# Patient Record
Sex: Female | Born: 1995 | Race: Black or African American | Hispanic: No | Marital: Single | Smoking: Never smoker
Health system: Southern US, Community
[De-identification: ages and names within clinical notes are randomized; demographics above are authoritative.]

## PROBLEM LIST (undated history)

## (undated) DIAGNOSIS — K635 Polyp of colon: Secondary | ICD-10-CM

## (undated) HISTORY — PX: COLOSTOMY: SHX63

## (undated) HISTORY — PX: COLOSTOMY REVERSAL: SHX5782

---

## 2017-09-04 ENCOUNTER — Encounter (HOSPITAL_COMMUNITY): Payer: Self-pay | Admitting: Emergency Medicine

## 2017-09-04 ENCOUNTER — Emergency Department (HOSPITAL_COMMUNITY)
Admission: EM | Admit: 2017-09-04 | Discharge: 2017-09-04 | Disposition: A | Discharge status: Home / Self Care | Payer: Self-pay | Attending: Emergency Medicine | Admitting: Emergency Medicine

## 2017-09-04 DIAGNOSIS — K529 Noninfective gastroenteritis and colitis, unspecified: Secondary | ICD-10-CM | POA: Insufficient documentation

## 2017-09-04 HISTORY — DX: Polyp of colon: K63.5

## 2017-09-04 LAB — COMPREHENSIVE METABOLIC PANEL
ALBUMIN: 3.7 g/dL (ref 3.5–5.0)
ALT: 14 U/L (ref 0–44)
ANION GAP: 8 (ref 5–15)
AST: 19 U/L (ref 15–41)
Alkaline Phosphatase: 58 U/L (ref 38–126)
BILIRUBIN TOTAL: 0.8 mg/dL (ref 0.3–1.2)
BUN: 7 mg/dL (ref 6–20)
CO2: 26 mmol/L (ref 22–32)
Calcium: 9 mg/dL (ref 8.9–10.3)
Chloride: 103 mmol/L (ref 98–111)
Creatinine, Ser: 0.89 mg/dL (ref 0.44–1.00)
GFR calc non Af Amer: >60 mL/min (ref >60)
GLUCOSE: 80 mg/dL (ref 70–99)
POTASSIUM: 3.9 mmol/L (ref 3.5–5.1)
SODIUM: 137 mmol/L (ref 135–145)
TOTAL PROTEIN: 6.9 g/dL (ref 6.5–8.1)

## 2017-09-04 LAB — LIPASE, BLOOD: Lipase: 31 U/L (ref 11–51)

## 2017-09-04 LAB — CBC
HEMATOCRIT: 44.1 % (ref 36.0–46.0)
Hemoglobin: 14.2 g/dL (ref 12.0–15.0)
MCH: 28.2 pg (ref 26.0–34.0)
MCHC: 32.2 g/dL (ref 30.0–36.0)
MCV: 87.7 fL (ref 78.0–100.0)
Platelets: 301 10*3/uL (ref 150–400)
RBC: 5.03 MIL/uL (ref 3.87–5.11)
RDW: 12.9 % (ref 11.5–15.5)
WBC: 5.5 10*3/uL (ref 4.0–10.5)

## 2017-09-04 LAB — I-STAT TROPONIN, ED: TROPONIN I, POC: 0.01 ng/mL (ref 0.00–0.08)

## 2017-09-04 LAB — POC URINE PREG, ED: Preg Test, Ur: NEGATIVE

## 2017-09-04 MED ORDER — ONDANSETRON HCL 4 MG PO TABS
4.0000 mg | ORAL_TABLET | Freq: Once | ORAL | Status: AC
  Administered 2017-09-04: 4 mg via ORAL
  Filled 2017-09-04: qty 1

## 2017-09-04 MED ORDER — LOPERAMIDE HCL 2 MG PO CAPS: 2.0000 mg | ORAL_CAPSULE | Freq: Four times a day (QID) | ORAL | 0 refills | Status: DC | PRN

## 2017-09-04 MED ORDER — ONDANSETRON HCL 4 MG PO TABS: 4.0000 mg | ORAL_TABLET | Freq: Four times a day (QID) | ORAL | 0 refills | Status: DC

## 2017-09-04 NOTE — ED Provider Notes (Signed)
[facility] Provider Note   CSN: [id number] Arrival date & time: 09/04/17  1308   History   Chief Complaint Chief Complaint  Patient presents with  . Emesis    HPI Jill Miller is a 22 y.o. female.   HPI   22 year old female presents today with complaints of patient notes symptoms started on Saturday evening into early Monday morning. She notes usually she has loose bowels as she had bowel resection previously. She notes this was more watery than normal; nonbloody. She notes several episodes today, she also notes 2 episodes of vomiting today, nonbloody. She notes she is unable to tolerate food or drink prior to evaluation. No medication prior to evaluation. She notes a generalized crampy abdominal sensation, non-localized per she denies any urinary complaints, denies any vaginal discharge or bleeding. She denies any fevers.  Past Medical History:  Diagnosis Date  . Colon polyps     There are no active problems to display for this patient.   Past Surgical History:  Procedure Laterality Date  . COLOSTOMY       OB History   None      Home Medications    Prior to Admission medications   Medication Sig Start Date End Date Taking? Authorizing Provider  ibuprofen (ADVIL,MOTRIN) 200 MG tablet Take 400 mg by mouth every 6 (six) hours as needed for headache (pain).   Yes [provider]  loperamide (IMODIUM) 2 MG capsule Take 1 capsule (2 mg total) by mouth 4 (four) times daily as needed for diarrhea or loose stools. 09/04/17   Lumi Winslett, Tinnie GensJeffrey, PA-C  ondansetron (ZOFRAN) 4 MG tablet Take 1 tablet (4 mg total) by mouth every 6 (six) hours. 09/04/17   Eyvonne MechanicHedges, Tiffanee Mcnee, PA-C    Family History No family history on file.  Social History Social History   Tobacco Use  . Smoking status: Not on file  Substance Use Topics  . Alcohol use: Not on file  . Drug use: Not on file     Allergies   Patient has no known  allergies.   Review of Systems Review of Systems  All other systems reviewed and are negative.   Physical Exam Updated Vital Signs BP 122/79 (BP Location: Right Arm)   Pulse 81   Temp 98.1 F (36.7 C) (Oral)   Resp 16   LMP  (Within Weeks)   SpO2 100%   Physical Exam  Constitutional: She is oriented to person, place, and time. She appears well-developed and well-nourished.  HENT:  Head: Normocephalic and atraumatic.  Eyes: Pupils are equal, round, and reactive to light. Conjunctivae are normal. Right eye exhibits no discharge. Left eye exhibits no discharge. No scleral icterus.  Neck: Normal range of motion. No JVD present. No tracheal deviation present.  Pulmonary/Chest: Effort normal. No stridor.  Abdominal: Soft. She exhibits no distension and no mass. There is tenderness. There is no rebound and no guarding. No hernia.  Minor generalized tenderness to palpation, nonfocal, no rebound or guarding  Neurological: She is alert and oriented to person, place, and time. Coordination normal.  Psychiatric: She has a normal mood and affect. Her behavior is normal. Judgment and thought content normal.  Nursing note and vitals reviewed.   ED Treatments / Results  Labs (all labs ordered are listed, but only abnormal results are displayed) Labs Reviewed  LIPASE, BLOOD  COMPREHENSIVE METABOLIC PANEL  CBC  I-STAT BETA HCG BLOOD, ED ([facility], [facility], [facility] ONLY)  I-STAT TROPONIN, ED  POC URINE PREG, ED    EKG None  Radiology No results found.  Procedures Procedures (including critical care time)  Medications Ordered in ED Medications  ondansetron (ZOFRAN) tablet 4 mg (4 mg Oral Given 09/04/17 1714)     Initial Impression / Assessment and Plan / ED Course  I have reviewed the triage vital signs and the nursing notes.  Pertinent labs & imaging results that were available during my care of the patient were reviewed by me and considered in my medical decision making (see chart for  details).     Labs: I stat trop, Lipase, CMP, CBC  Imaging:  Consults:  Therapeutics: Zofran  Discharge Meds: Zofran, Imodium  Assessment/Plan: 21 YOF presents today with likely viral gastroenteritis. She has a soft nonfocal abdomen afebrile with reassuring laboratory analysis. Low suspicion for acute intra-abdominal pathology including appendicitis, diverticulitis, cholecystitis, or any other life-threatening etiology. Patient tolerating by mouth here discharge and matting care instructions and strict return precautions. She verbalized understanding and agreement to today's plan.   Final Clinical Impressions(s) / ED Diagnoses   Final diagnoses:  Gastroenteritis    ED Discharge Orders         Ordered    ondansetron (ZOFRAN) 4 MG tablet  Every 6 hours     09/04/17 1803    loperamide (IMODIUM) 2 MG capsule  4 times daily PRN     09/04/17 1803           Eyvonne MechanicHedges, Orion Mole, PA-C 09/06/17 1215    Loren RacerYelverton, David, MD 09/08/17 [phone]

## 2017-09-04 NOTE — ED Notes (Signed)
Pt given cup to attempt urine sample. 

## 2017-09-04 NOTE — ED Notes (Signed)
Pt stable, ambulatory, states understanding of discharge instructions 

## 2017-09-04 NOTE — ED Notes (Signed)
Pt unable to provide sample of urine. Pt is aware that urine sample is needed. Will try again later.

## 2017-09-04 NOTE — Discharge Instructions (Addendum)
Please read attached information. If you experience any new or worsening signs or symptoms please return to the emergency room for evaluation. Please follow-up with your primary care provider or specialist as discussed. Please use medication prescribed only as directed and discontinue taking if you have any concerning signs or symptoms.   °

## 2017-09-04 NOTE — ED Triage Notes (Signed)
Patient complains of abdominal pain, emesis and diarrhea since Saturday. Denies other complaints. Patient alert, oriented, and ambulating independently with steady gait.

## 2020-01-22 ENCOUNTER — Other Ambulatory Visit: Payer: Self-pay

## 2020-01-22 ENCOUNTER — Ambulatory Visit (INDEPENDENT_AMBULATORY_CARE_PROVIDER_SITE_OTHER): Payer: Commercial Managed Care - PPO

## 2020-01-22 ENCOUNTER — Ambulatory Visit
Admission: EM | Admit: 2020-01-22 | Discharge: 2020-01-22 | Disposition: A | Discharge status: Home / Self Care | Payer: Commercial Managed Care - PPO | Attending: Emergency Medicine | Admitting: Emergency Medicine

## 2020-01-22 DIAGNOSIS — S92355A Nondisplaced fracture of fifth metatarsal bone, left foot, initial encounter for closed fracture: Secondary | ICD-10-CM | POA: Diagnosis not present

## 2020-01-22 DIAGNOSIS — M79672 Pain in left foot: Secondary | ICD-10-CM

## 2020-01-22 MED ORDER — IBUPROFEN 800 MG PO TABS
800.0000 mg | ORAL_TABLET | Freq: Three times a day (TID) | ORAL | 0 refills | Status: AC
Start: 2020-01-22 — End: ?

## 2020-01-22 NOTE — ED Triage Notes (Signed)
Pt presents to Urgent Care with c/o L foot/ankle pain and swelling following a fall yesterday in a playground.

## 2020-01-22 NOTE — Discharge Instructions (Addendum)
Crutches and boot for on weightberaing until cleared by sports medicine Ice and elevate Use anti-inflammatories for pain/swelling. You may take up to 800 mg Ibuprofen every 8 hours with food. You may supplement Ibuprofen with Tylenol [phone] mg every 8 hours.

## 2020-01-22 NOTE — ED Provider Notes (Signed)
[facility]    CSN: [id number] Arrival date & time: 01/22/20  0912      History   Chief Complaint Chief Complaint  Patient presents with   Foot Injury    HPI Jill Miller is a 24 y.o. female presenting today for evaluation of left foot and ankle pain.  Patient injured yesterday while falling at a playground. Reports pain at base of foot, more prominent on the lateral aspect. Denies any history of prior fractures or injuries.  HPI  Past Medical History:  Diagnosis Date   Colon polyps     There are no problems to display for this patient.   Past Surgical History:  Procedure Laterality Date   COLOSTOMY     COLOSTOMY REVERSAL      OB History   No obstetric history on file.      Home Medications    Prior to Admission medications   Medication Sig Start Date End Date Taking? Authorizing Provider  ibuprofen (ADVIL) 800 MG tablet Take 1 tablet (800 mg total) by mouth 3 (three) times daily. 01/22/20  Yes Fareeda Downard C, PA-C    Family History Family History  Problem Relation Age of Onset   Healthy Mother     Social History Social History   Tobacco Use   Smoking status: Never Smoker   Smokeless tobacco: Never Used  Building services engineer Use: Every day  Substance Use Topics   Alcohol use: Yes    Comment: 1-2 drinks twice weekly   Drug use: Yes    Frequency: 7.0 times per week    Types: Marijuana     Allergies   Patient has no known allergies.   Review of Systems Review of Systems  Constitutional: Negative for fatigue and fever.  Eyes: Negative for visual disturbance.  Respiratory: Negative for shortness of breath.   Cardiovascular: Negative for chest pain.  Gastrointestinal: Negative for abdominal pain, nausea and vomiting.  Musculoskeletal: Positive for arthralgias, gait problem and joint swelling.  Skin: Negative for color change, rash and wound.  Neurological: Negative for dizziness, weakness, light-headedness and  headaches.     Physical Exam Triage Vital Signs ED Triage Vitals  Enc Vitals Group     BP 01/22/20 1104 140/71     Pulse Rate 01/22/20 1104 88     Resp 01/22/20 1104 20     Temp 01/22/20 1104 97.9 F (36.6 C)     Temp Source 01/22/20 1104 Oral     SpO2 01/22/20 1104 100 %     Weight --      Height --      Head Circumference --      Peak Flow --      Pain Score 01/22/20 1103 5     Pain Loc --      Pain Edu? --      Excl. in GC? --    No data found.  Updated Vital Signs BP 140/71 (BP Location: Left Arm)    Pulse 88    Temp 97.9 F (36.6 C) (Oral)    Resp 20    LMP 01/16/2020 (Approximate)    SpO2 100%   Visual Acuity Right Eye Distance:   Left Eye Distance:   Bilateral Distance:    Right Eye Near:   Left Eye Near:    Bilateral Near:     Physical Exam Vitals and nursing note reviewed.  Constitutional:      Appearance: She is well-developed and well-nourished.  Comments: No acute distress  HENT:     Head: Normocephalic and atraumatic.     Nose: Nose normal.  Eyes:     Conjunctiva/sclera: Conjunctivae normal.  Cardiovascular:     Rate and Rhythm: Normal rate.  Pulmonary:     Effort: Pulmonary effort is normal. No respiratory distress.  Abdominal:     General: There is no distension.  Musculoskeletal:        General: Normal range of motion.     Cervical back: Neck supple.     Comments: Left foot with swelling noted to proximal dorsum of foot, dorsalis pedis 2+, nontender to medial lateral malleolus, nontender distal metatarsals, sensation intact distally  Skin:    General: Skin is warm and dry.  Neurological:     Mental Status: She is alert and oriented to person, place, and time.  Psychiatric:        Mood and Affect: Mood and affect normal.      UC Treatments / Results  Labs (all labs ordered are listed, but only abnormal results are displayed) Labs Reviewed - No data to display  EKG   Radiology DG Foot Complete Left  Result Date:  01/22/2020 CLINICAL DATA:  Pain and swelling to left foot after injury EXAM: LEFT FOOT - COMPLETE 3+ VIEW COMPARISON:  None. FINDINGS: Nondisplaced base of fifth left metatarsal fracture, which appears intra-articular. No additional fracture. Soft tissue swelling throughout the left midfoot. Lisfranc joint appears intact. No dislocation. No focal osseous lesions. No significant arthropathy. No radiopaque foreign bodies. IMPRESSION: Nondisplaced base of fifth left metatarsal fracture, which appears intra-articular. Electronically Signed   By: Delbert Phenix M.D.   On: 01/22/2020 11:31    Procedures Procedures (including critical care time)  Medications Ordered in UC Medications - No data to display  Initial Impression / Assessment and Plan / UC Course  I have reviewed the triage vital signs and the nursing notes.  Pertinent labs & imaging results that were available during my care of the patient were reviewed by me and considered in my medical decision making (see chart for details).     Nondisplaced fifth metatarsal fracture, placing in cam walker boot and nonweightbearing with crutches. Will have follow-up with sports medicine. Ice elevation anti-inflammatories.  Discussed strict return precautions. Patient verbalized understanding and is agreeable with plan.  Final Clinical Impressions(s) / UC Diagnoses   Final diagnoses:  Nondisplaced fracture of fifth metatarsal bone, left foot, initial encounter for closed fracture     Discharge Instructions     Crutches and boot for on weightberaing until cleared by sports medicine Ice and elevate Use anti-inflammatories for pain/swelling. You may take up to 800 mg Ibuprofen every 8 hours with food. You may supplement Ibuprofen with Tylenol [phone] every 8 hours.      ED Prescriptions    Medication Sig Dispense Auth. Provider   ibuprofen (ADVIL) 800 MG tablet Take 1 tablet (800 mg total) by mouth 3 (three) times daily. 21 tablet  Keyandre Pileggi, [place] C, PA-C     PDMP not reviewed this encounter.   Lew Dawes, [place] 01/22/20 1154

## 2020-01-27 ENCOUNTER — Other Ambulatory Visit: Payer: Self-pay

## 2020-01-27 ENCOUNTER — Ambulatory Visit (INDEPENDENT_AMBULATORY_CARE_PROVIDER_SITE_OTHER): Payer: Commercial Managed Care - PPO | Admitting: Family Medicine

## 2020-01-27 ENCOUNTER — Encounter: Payer: Self-pay | Admitting: Family Medicine

## 2020-01-27 VITALS — BP 127/63 | Ht 59.0 in | Wt 109.0 lb

## 2020-01-27 DIAGNOSIS — S92355D Nondisplaced fracture of fifth metatarsal bone, left foot, subsequent encounter for fracture with routine healing: Secondary | ICD-10-CM

## 2020-01-27 NOTE — Patient Instructions (Addendum)
You have an avulsion fracture of your fifth metatarsal. Wear boot when up and walking around for next 5 weeks. Ok to take this off if lying down, to shower, to ice area. You can wean off the crutches when you feel comfortable. Icing 15 minutes at a time 3-4 times a day. Ok to take tylenol and ibuprofen for pain if needed. Follow up with [place] in 1 week and get x-rays right before you come to the visit. Out of work until that visit - you may be able to return on light duty at that time.

## 2020-01-27 NOTE — Progress Notes (Signed)
PCP: Pcp, No  Subjective:   HPI: Patient is a 25 y.o. female here for left foot injury.  Patient reports last Tuesday 12/28 she was at a playground when she turned her left foot/ankle. Immediate pain, swelling noted lateral left foot, difficulty bearing weight. No prior injuries to this foot or ankle. She went to urgent care, diagnosed with 5th metatarsal fracture, placed in boot with crutches. Has been putting some weight on this foot. Pain level 4-5/10 at rest, up to 7-8/10 at worst with ambulation. Taking ibuprofen as needed for pain.  Past Medical History:  Diagnosis Date  . Colon polyps     Current Outpatient Medications on File Prior to Visit  Medication Sig Dispense Refill  . ibuprofen (ADVIL) 800 MG tablet Take 1 tablet (800 mg total) by mouth 3 (three) times daily. 21 tablet 0   No current facility-administered medications on file prior to visit.    Past Surgical History:  Procedure Laterality Date  . COLOSTOMY    . COLOSTOMY REVERSAL      No Known Allergies  Social History   Socioeconomic History  . Marital status: Single    Spouse name: Not on file  . Number of children: Not on file  . Years of education: Not on file  . Highest education level: Not on file  Occupational History  . Not on file  Tobacco Use  . Smoking status: Never Smoker  . Smokeless tobacco: Never Used  Vaping Use  . Vaping Use: Every day  Substance and Sexual Activity  . Alcohol use: Yes    Comment: 1-2 drinks twice weekly  . Drug use: Yes    Frequency: 7.0 times per week    Types: Marijuana  . Sexual activity: Not on file  Other Topics Concern  . Not on file  Social History Narrative  . Not on file   Social Determinants of Health   Financial Resource Strain: Not on file  Food Insecurity: Not on file  Transportation Needs: Not on file  Physical Activity: Not on file  Stress: Not on file  Social Connections: Not on file  Intimate Partner Violence: Not on file     Family History  Problem Relation Age of Onset  . Healthy Mother     BP 127/63   Ht 4\' 11"  (1.499 m)   Wt 109 lb (49.4 kg)   LMP 01/16/2020 (Approximate)   BMI 22.02 kg/m   [facility] 01/27/2020  Frequency of aerobic exercise (# of days/week) 0  Average time in minutes 0  Frequency of strengthening activities (# of days/week) 0    No flowsheet data found.  Review of Systems: See HPI above.     Objective:  Physical Exam:  Gen: NAD, comfortable in exam room  Left foot/ankle: No gross deformity, swelling, ecchymoses Motion not tested with known fracture TTP greatest over base of 5th metatarsal.  Mild tenderness dorsal foot.  No ATFL tenderness. Negative ant drawer and negative talar tilt.   Negative syndesmotic compression. Thompsons test negative. NV intact distally.   Assessment & Plan:  1. Left 5th metatarsal avulsion fracture - independently reviewed radiographs and noted nondisplaced avulsion type fracture of 5th metatarsal.  Continue with boot when ambulatory - weight bear as tolerated.  F/u in 1 week with repeat radiographs before she comes in.  Tylenol, ibuprofen if needed.

## 2020-01-31 ENCOUNTER — Ambulatory Visit
Admission: RE | Admit: 2020-01-31 | Discharge: 2020-01-31 | Disposition: A | Discharge status: Home / Self Care | Payer: Commercial Managed Care - PPO | Source: Ambulatory Visit | Attending: Family Medicine | Admitting: Family Medicine

## 2020-01-31 ENCOUNTER — Other Ambulatory Visit: Payer: Self-pay

## 2020-01-31 DIAGNOSIS — S92355D Nondisplaced fracture of fifth metatarsal bone, left foot, subsequent encounter for fracture with routine healing: Secondary | ICD-10-CM

## 2020-02-03 ENCOUNTER — Other Ambulatory Visit: Payer: Self-pay

## 2020-02-03 ENCOUNTER — Ambulatory Visit (INDEPENDENT_AMBULATORY_CARE_PROVIDER_SITE_OTHER): Payer: Commercial Managed Care - PPO | Admitting: Sports Medicine

## 2020-02-03 VITALS — BP 108/72 | Ht 59.0 in | Wt 109.0 lb

## 2020-02-03 DIAGNOSIS — S92355D Nondisplaced fracture of fifth metatarsal bone, left foot, subsequent encounter for fracture with routine healing: Secondary | ICD-10-CM | POA: Diagnosis not present

## 2020-02-03 NOTE — Progress Notes (Signed)
    SUBJECTIVE:   CHIEF COMPLAINT / HPI: F/u L fifth metatarsal fracture  Jill Miller is an otherwise healthy 25 year old female presenting for follow-up of metatarsal fracture.  Initially diagnosed with base of fifth metatarsal fracture on 12/29, placed in boot at that time with crutches.  Foot XR showing avulsion type nondisplaced fracture.  Last seen on 1/3 in the sports medicine office, recommended weightbearing as tolerated with boot at all times.  Today, she reports that her pain is improving.  She is able to weight-bear while wearing her boot without crutches, no longer using them at all.  Has noticed less swelling of the lateral aspect.  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  Repeat XR on 1/7 showing fracture remains nondisplaced without any changes in alignment or callus formation.  She also reports bilateral sore spots on the top of her feet after wearing work shoes for the past several months.  Feels like the top of the shoe rubs on this area and makes it uncomfortable.  PERTINENT  PMH / PSH: None  OBJECTIVE:   BP 108/72   Ht 4\' 11"  (1.499 m)   Wt 109 lb (49.4 kg)   LMP 01/16/2020 (Approximate)   BMI 22.02 kg/m   General: Alert, NAD HEENT: NCAT, MMM Lungs: No increased WOB  Ext: Warm, dry, 2+ distal pulses, no edema   L Foot: Inspection:  No obvious bony deformity, swelling, erythema, or ecchymoses.  Note region of circular hyperpigmentation present on bilateral medial dorsal aspect.  Normal arch.  Palpation: TTP over base of fifth metatarsal and lateral/dorsal foot.  Has tenderness in the areas of hyperpigmentation as above at joint of cuneiform/navicular bone bilaterally. ROM: Did not test with known fracture, however preserved motion at ankle joint. Neurovascular: Sensation to light touch intact throughout, palpable DP/PT pulses Special tests: Negative anterior drawer and talar tilt.  ASSESSMENT/PLAN:   Left fifth metatarsal  avulsion fracture: Acute, improving. Repeat XR reassuring, nondisplaced.  Will continue in boot with weightbearing as tolerated for the next 4 weeks.  Continue ice/Tylenol/ibuprofen PRN. Provided work note, if able to do seated duties may continue at work, otherwise will provide short-term disability paperwork to be out for the next 4 weeks.  Bilateral early callus formation: Chronic.  Present at cuneiform/navicular articulation with hyperpigmentation overlying area.  Suspect due to repeated irritation of her shoes.  Reviewed recent XR of the left foot in this region without any significant bony abnormality.  Recommended trialing donut callus padding to relieve pressure.  Follow-up in approximately 4 weeks with repeat x-ray prior to visit, sooner if worsening.  01/18/2020, DO [facility]   Patient seen and evaluated with the resident.  I agree with the above plan of care.  X-rays today continue to show a nondisplaced avulsion fracture at the base of the fifth metatarsal of the left foot.  She will remain in her cam walker for 4 more weeks.  Follow-up at the end of that time for reevaluation.  She will get a follow-up x-ray prior to that visit.  She is given a work note requesting sitting duty only.  If that is not available, then she may need to be out of work for the next month.

## 2020-02-28 ENCOUNTER — Other Ambulatory Visit: Payer: Self-pay

## 2020-02-28 ENCOUNTER — Ambulatory Visit
Admission: RE | Admit: 2020-02-28 | Discharge: 2020-02-28 | Disposition: A | Discharge status: Home / Self Care | Payer: Commercial Managed Care - PPO | Source: Ambulatory Visit | Attending: Family Medicine | Admitting: Family Medicine

## 2020-02-28 DIAGNOSIS — S92355D Nondisplaced fracture of fifth metatarsal bone, left foot, subsequent encounter for fracture with routine healing: Secondary | ICD-10-CM

## 2020-03-02 ENCOUNTER — Encounter: Payer: Self-pay | Admitting: Family Medicine

## 2020-03-02 ENCOUNTER — Other Ambulatory Visit: Payer: Self-pay

## 2020-03-02 ENCOUNTER — Ambulatory Visit (INDEPENDENT_AMBULATORY_CARE_PROVIDER_SITE_OTHER): Payer: Commercial Managed Care - PPO | Admitting: Family Medicine

## 2020-03-02 VITALS — BP 110/74 | Ht 59.0 in | Wt 108.0 lb

## 2020-03-02 DIAGNOSIS — S92355D Nondisplaced fracture of fifth metatarsal bone, left foot, subsequent encounter for fracture with routine healing: Secondary | ICD-10-CM | POA: Diagnosis not present

## 2020-03-02 NOTE — Patient Instructions (Signed)
You're doing great! Keep the boot with you for the next 1-2 weeks and wear it only when you're going to be on irregular ground (like in the yard). Otherwise stop using the boot. Call me with any questions otherwise follow up as needed.

## 2020-03-02 NOTE — Progress Notes (Signed)
PCP: Renaye Rakers, MD  Subjective:   HPI: Patient is a 25 y.o. female here for left foot injury.  1/10: Jill Miller is an otherwise healthy 25 year old female presenting for follow-up of metatarsal fracture.  Initially diagnosed with base of fifth metatarsal fracture on 12/29, placed in boot at that time with crutches.  Foot XR showing avulsion type nondisplaced fracture.  Last seen on 1/3 in the sports medicine office, recommended weightbearing as tolerated with boot at all times.  Today, she reports that her pain is improving.  She is able to weight-bear while wearing her boot without crutches, no longer using them at all.  Has noticed less swelling of the lateral aspect.  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  Repeat XR on 1/7 showing fracture remains nondisplaced without any changes in alignment or callus formation.  She also reports bilateral sore spots on the top of her feet after wearing work shoes for the past several months.  Feels like the top of the shoe rubs on this area and makes it uncomfortable.  2/7: Patient reports she's feeling much better. Able to walk some around house without the boot without pain. Still wearing boot rest of the time. Not taking anything for pain.  Past Medical History:  Diagnosis Date  . Colon polyps     Current Outpatient Medications on File Prior to Visit  Medication Sig Dispense Refill  . ibuprofen (ADVIL) 800 MG tablet Take 1 tablet (800 mg total) by mouth 3 (three) times daily. 21 tablet 0   No current facility-administered medications on file prior to visit.    Past Surgical History:  Procedure Laterality Date  . COLOSTOMY    . COLOSTOMY REVERSAL      No Known Allergies  Social History   Socioeconomic History  . Marital status: Single    Spouse name: Not on file  . Number of children: Not on file  . Years of education: Not on file  . Highest education level: Not on file   Occupational History  . Not on file  Tobacco Use  . Smoking status: Never Smoker  . Smokeless tobacco: Never Used  Vaping Use  . Vaping Use: Every day  Substance and Sexual Activity  . Alcohol use: Yes    Comment: 1-2 drinks twice weekly  . Drug use: Yes    Frequency: 7.0 times per week    Types: Marijuana  . Sexual activity: Not on file  Other Topics Concern  . Not on file  Social History Narrative  . Not on file   Social Determinants of Health   Financial Resource Strain: Not on file  Food Insecurity: Not on file  Transportation Needs: Not on file  Physical Activity: Not on file  Stress: Not on file  Social Connections: Not on file  Intimate Partner Violence: Not on file    Family History  Problem Relation Age of Onset  . Healthy Mother     BP 110/74   Ht 4\' 11"  (1.499 m)   Wt 108 lb (49 kg)   BMI 21.81 kg/m   [facility] 01/27/2020  Frequency of aerobic exercise (# of days/week) 0  Average time in minutes 0  Frequency of strengthening activities (# of days/week) 0    No flowsheet data found.  Review of Systems: See HPI above.     Objective:  Physical Exam:  Gen: NAD, comfortable in exam room  Left foot/ankle: No gross deformity, swelling, ecchymoses FROM with normal strength No TTP including 5th metatarsal Negative ant drawer and negative talar tilt.   Thompsons test negative. NV intact distally.   Assessment & Plan:  1. Left 5th metatarsal avulsion fracture - over 5 weeks out now.  Clinically improving and radiographs show interval healing as well.  Transition out of boot now.  Tylenol if she has some soreness.  Otherwise activities as tolerated, f/u prn.

## 2022-01-02 IMAGING — DX DG FOOT COMPLETE 3+V*L*
3 series · 3 of 3 positions shown · non-contrast
Comparison: None.

CLINICAL DATA: Pain and swelling to left foot after injury

EXAM:
LEFT FOOT - COMPLETE 3+ VIEW

[foot supine dp]
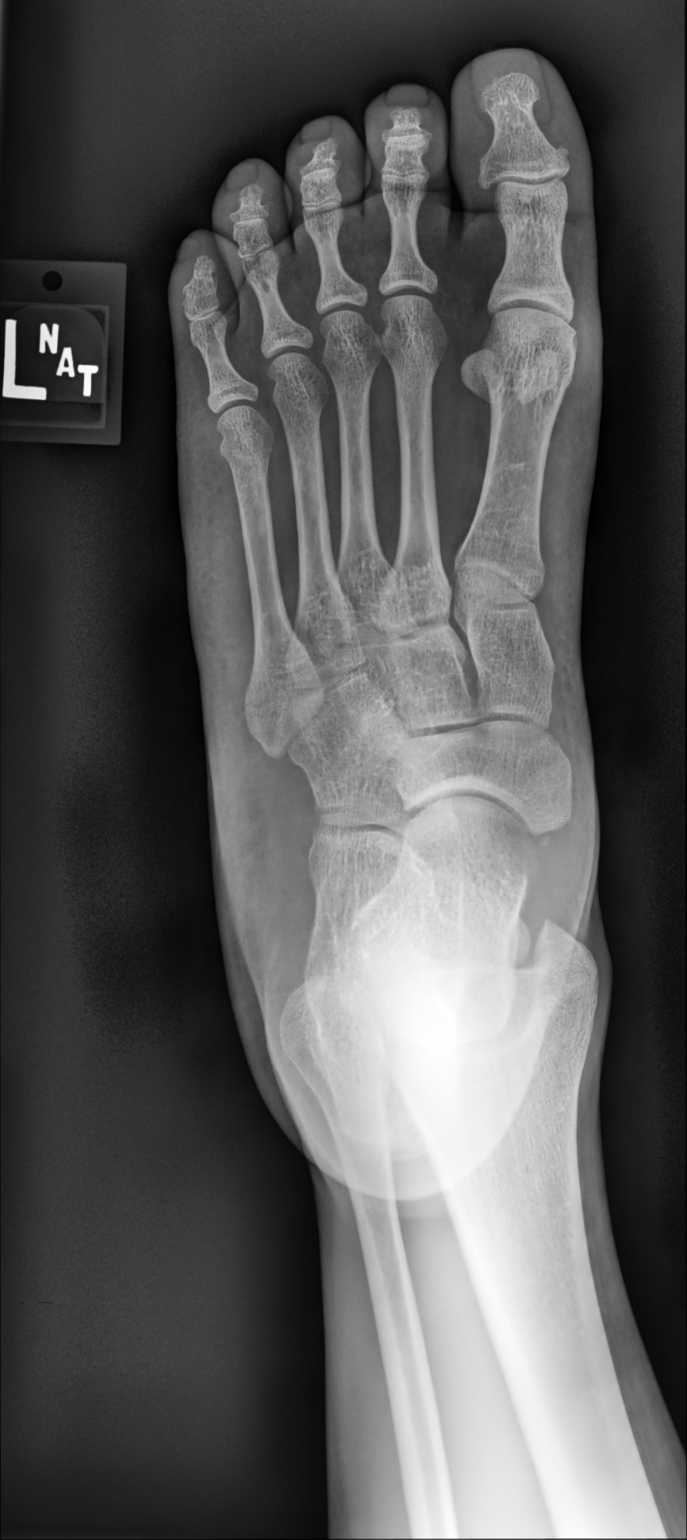

[foot medial oblique]
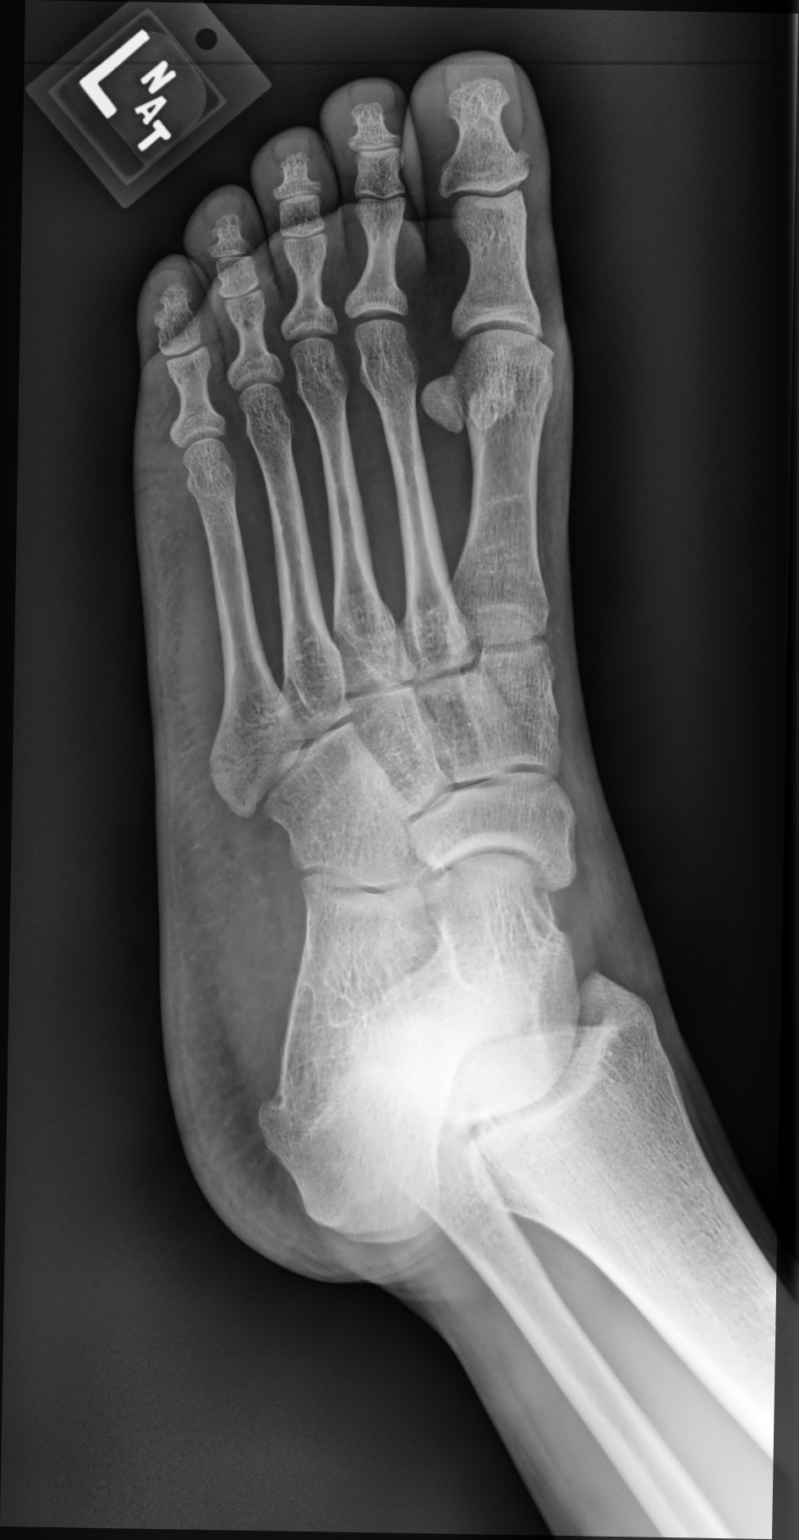

[foot supine lat]
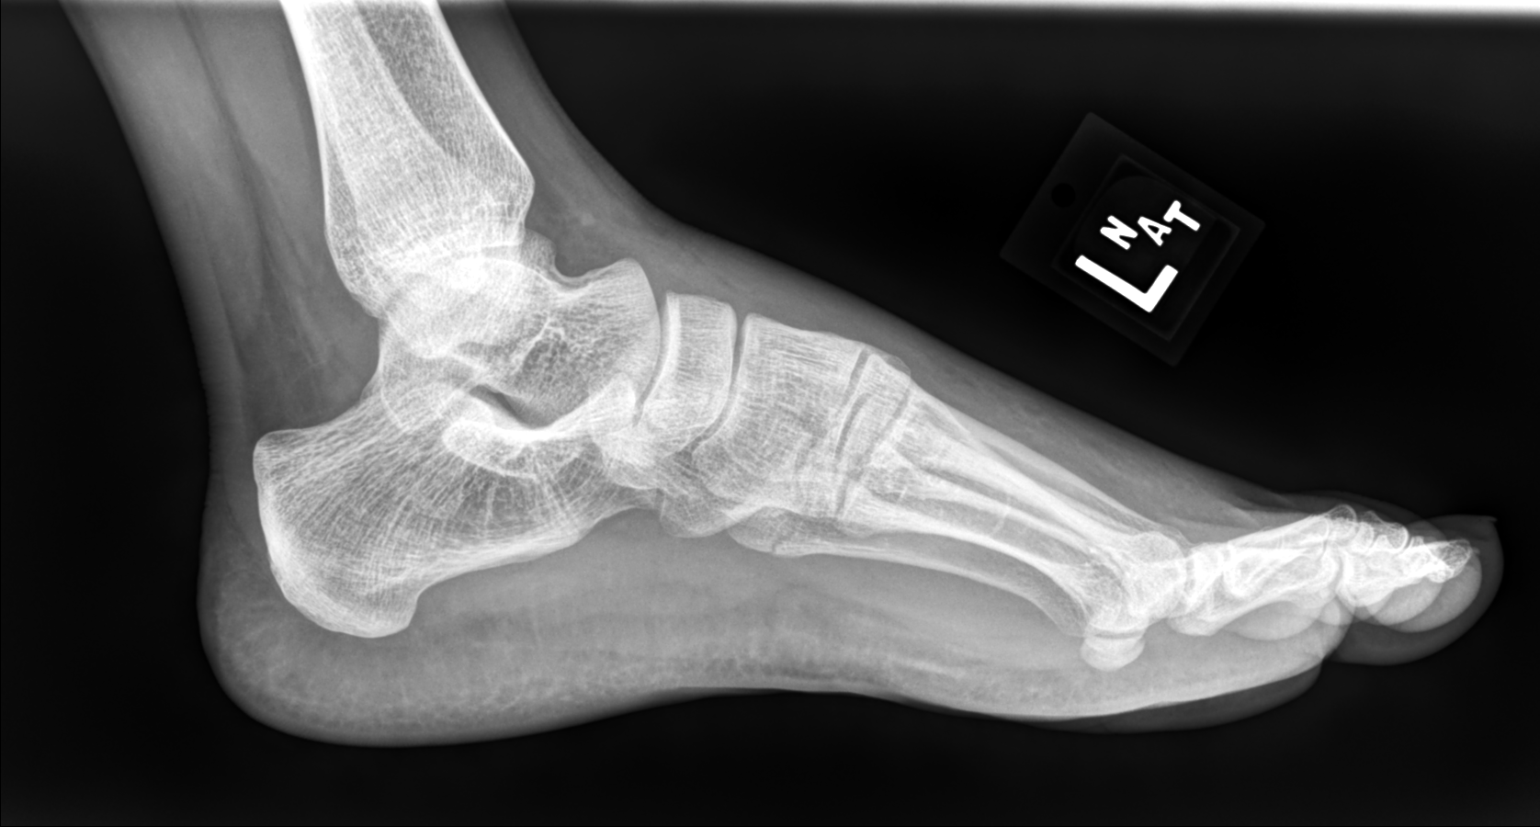

[3 of 3 positions shown; findings below may reference images not displayed]

FINDINGS: Nondisplaced base of fifth left metatarsal fracture, which appears
intra-articular. No additional fracture. Soft tissue swelling
throughout the left midfoot. Lisfranc joint appears intact. No
dislocation. No focal osseous lesions. No significant arthropathy.
No radiopaque foreign bodies.
IMPRESSION: Nondisplaced base of fifth left metatarsal fracture, which appears
intra-articular.

## 2022-01-11 IMAGING — DX DG FOOT COMPLETE 3+V*L*
3 series · 3 of 3 positions shown · non-contrast
Comparison: 01/22/2020

CLINICAL DATA: Follow-up stress fracture of the fifth metatarsal.

EXAM:
LEFT FOOT - COMPLETE 3+ VIEW

[dg foot complete left (1 of 3)]
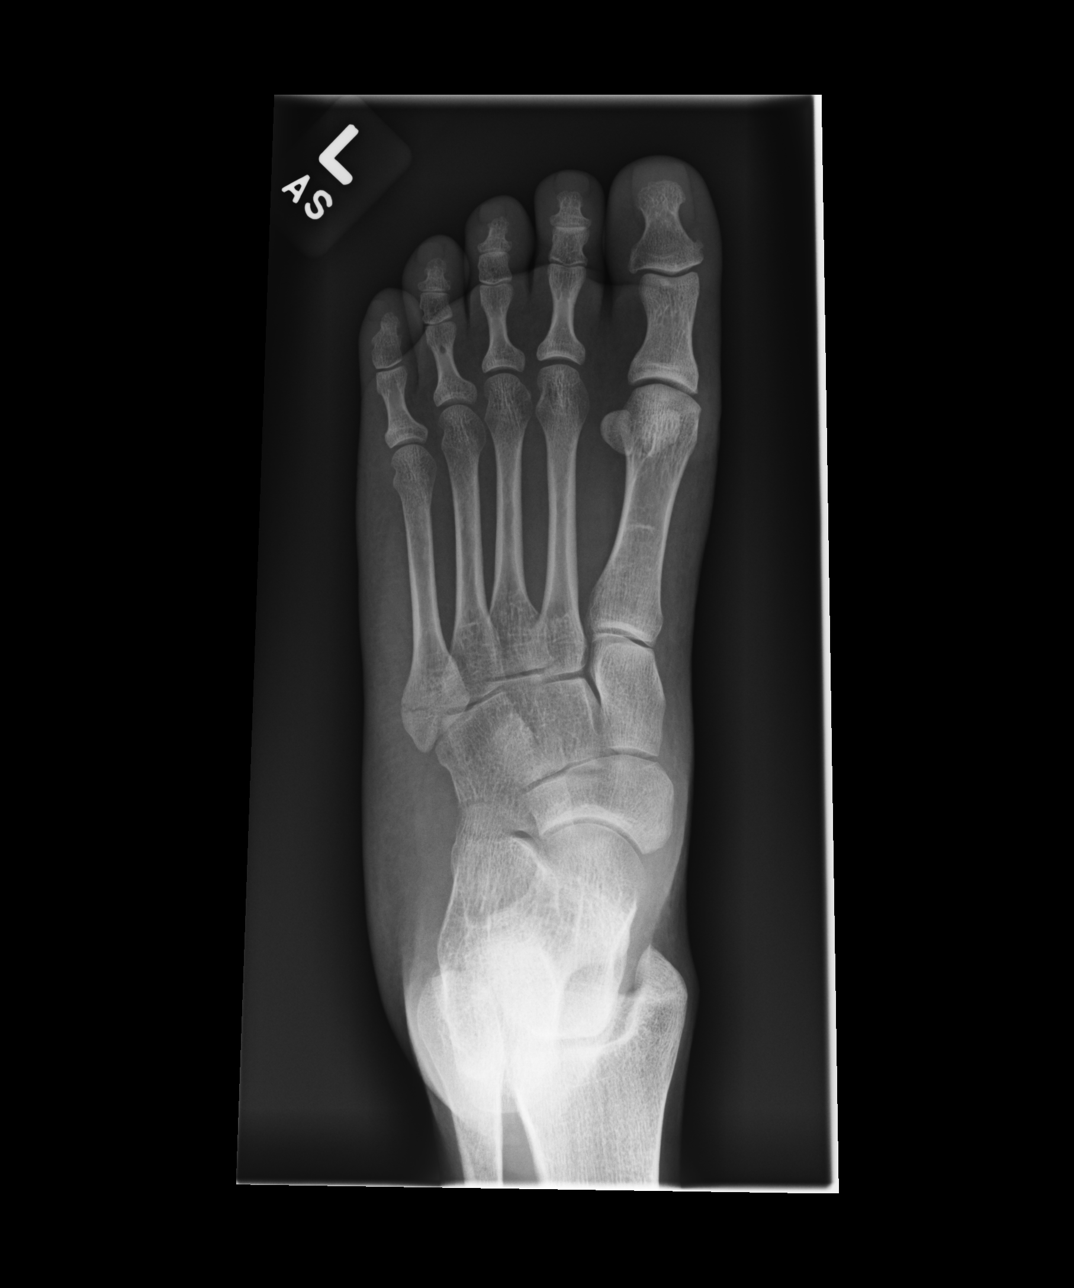

[dg foot complete left (2 of 3)]
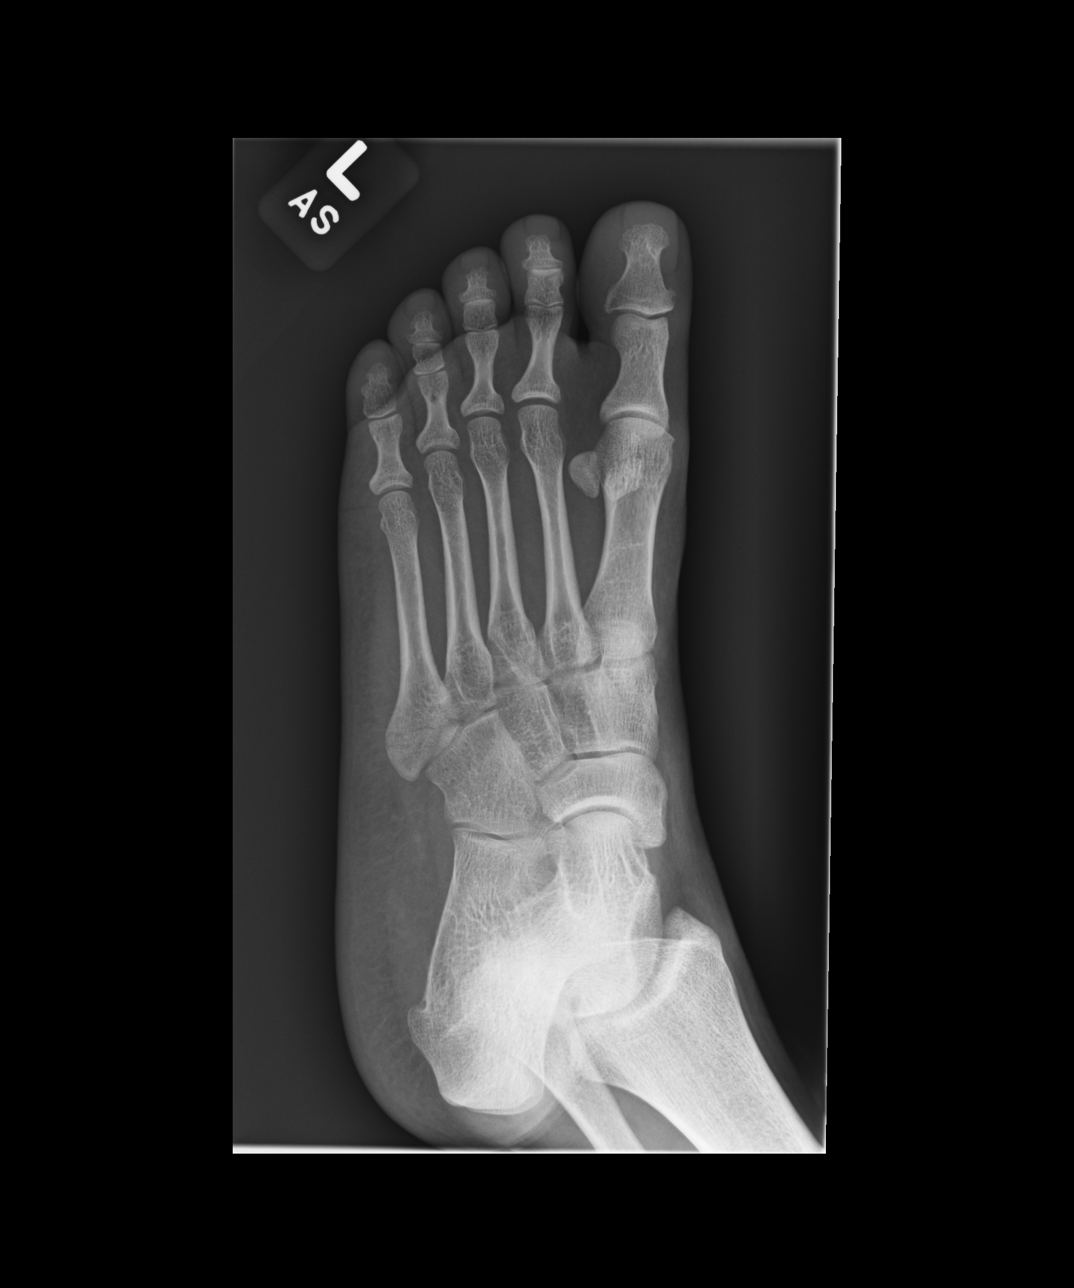

[dg foot complete left (3 of 3)]
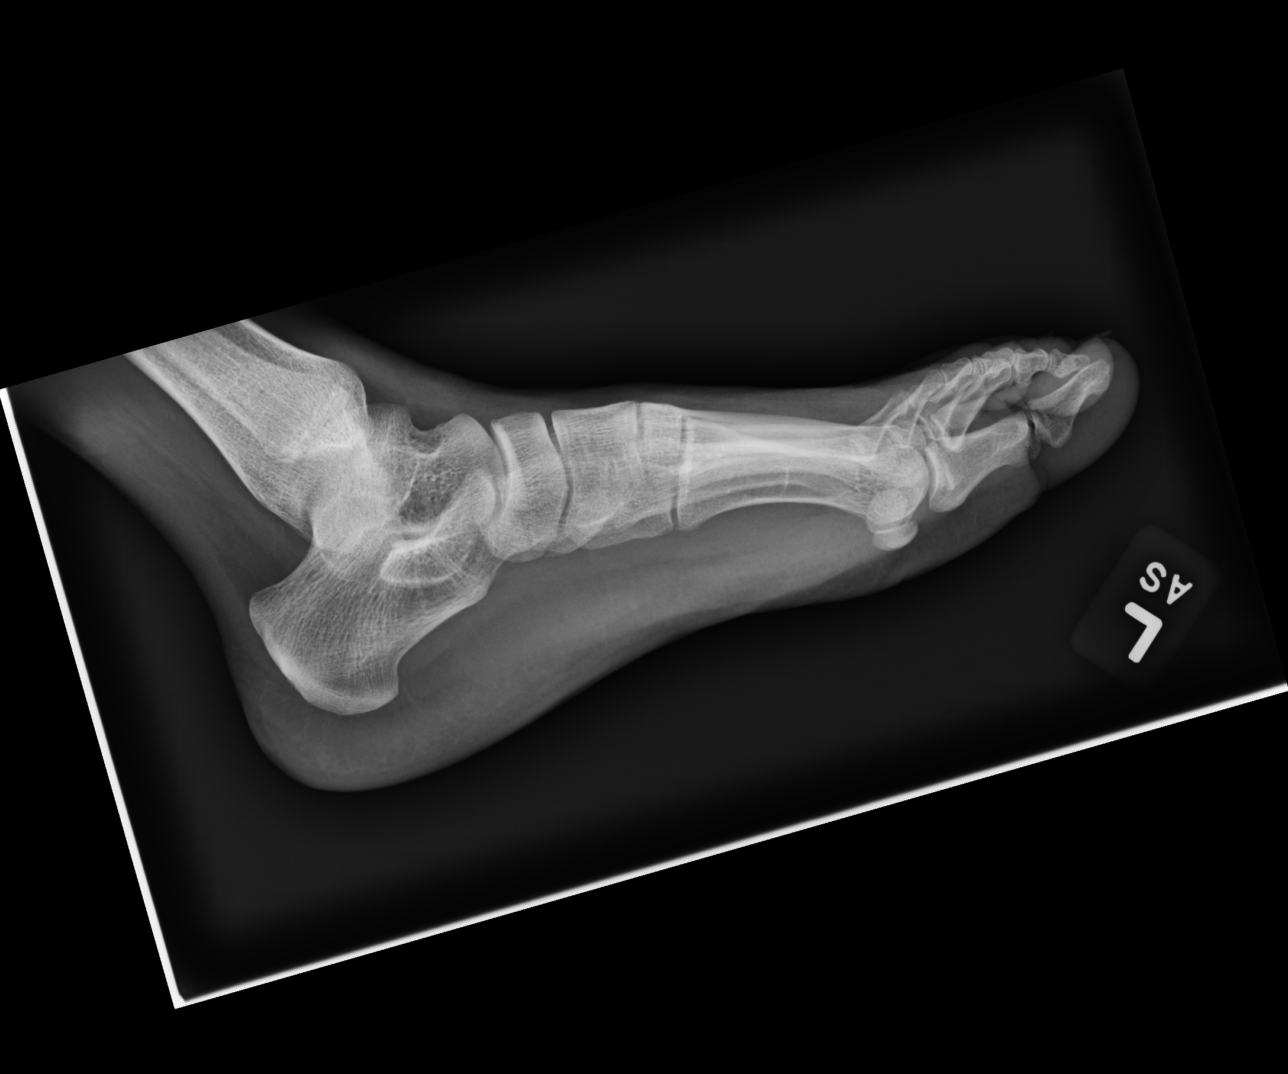

[3 of 3 positions shown; findings below may reference images not displayed]

FINDINGS: Redemonstrated nondisplaced fracture involving the base of the fifth
metatarsal without change in alignment or significant interval
callus formation. A minimal amount of adjacent soft tissue swelling
persists. No radiopaque foreign body.

No additional fracture is identified. Joint spaces appear preserved.
No significant hallux valgus deformity. No erosions. No plantar
calcaneal spur.
IMPRESSION: Nondisplaced fracture involving the base of the fifth metatarsal
without change in alignment or significant interval callus
formation.

## 2023-09-01 ENCOUNTER — Ambulatory Visit (HOSPITAL_COMMUNITY): Admission: EM | Admit: 2023-09-01 | Discharge: 2023-09-02 | Disposition: A | Discharge status: Home / Self Care

## 2023-09-01 DIAGNOSIS — F331 Major depressive disorder, recurrent, moderate: Secondary | ICD-10-CM

## 2023-09-01 NOTE — ED Provider Notes (Incomplete)
 [facility] Medical Screening Exam  Patient Name: Jill Miller MRN: [id number] Date of Evaluation: 09/01/23 Chief Complaint:   Diagnosis:  Final diagnoses:  None    History of Present illness: Jill Miller is a 28 y.o. female. ***    Psychiatric Specialty Exam  Presentation  General Appearance:Casual  Eye Contact:Good  Speech:Clear and Coherent  Speech Volume:Normal  Handedness:Right   Mood and Affect  Mood: Depressed  Affect: Appropriate   Thought Process  Thought Processes: Coherent  Descriptions of Associations:Intact  Orientation:Full (Time, Place and Person)  Thought Content:WDL    Hallucinations:None  Ideas of Reference:None  Suicidal Thoughts:No  Homicidal Thoughts:No   Sensorium  Memory: Recent Good; Immediate Good; Remote Good  Judgment: Good  Insight: Good   Executive Functions  Concentration: Good  Attention Span: Good  Recall: Good  Fund of Knowledge: Good  Language: Good   Psychomotor Activity  Psychomotor Activity: Normal   Assets  Assets: Communication Skills; Housing; Physical Health; Resilience   Sleep  Sleep: Poor  Number of hours:  5   Physical Exam: Physical Exam Review of Systems  Constitutional: Negative.   HENT: Negative.    Eyes: Negative.   Respiratory: Negative.    Cardiovascular: Negative.   Gastrointestinal: Negative.   Genitourinary: Negative.   Musculoskeletal: Negative.   Skin: Negative.   Neurological: Negative.   Endo/Heme/Allergies: Negative.   Psychiatric/Behavioral:  Positive for depression.    Blood pressure (!) 131/96, pulse 100, temperature 98.5 F (36.9 C), resp. rate 16, SpO2 100%. There is no height or weight on file to calculate BMI.  Musculoskeletal: Strength & Muscle Tone: within normal limits Gait & Station: normal Patient leans: N/A   [facility] MSE Discharge Disposition for Follow up and Recommendations: Based on my evaluation  the patient does not appear to have an emergency medical condition and can be discharged with resources and follow up care in outpatient services for Medication Management and Individual Therapy   Trevaun Rendleman E Torrey Ballinas, NP 09/01/2023, 11:55 PM
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
## 2023-09-01 NOTE — Discharge Instructions (Addendum)
  Discharge recommendations:  Patient is to take medications as prescribed. Please see information for follow-up appointment with psychiatry and therapy. Please follow up with your primary care provider for all medical related needs.   Therapy: We recommend that patient participate in individual therapy to address mental health concerns.  Medications: The patient or guardian is to contact a medical professional and/or outpatient provider to address any new side effects that develop. The patient or guardian should update outpatient providers of any new medications and/or medication changes.    Safety:  The patient should abstain from use of illicit substances/drugs and abuse of any medications. If symptoms worsen or do not continue to improve or if the patient becomes actively suicidal or homicidal then it is recommended that the patient return to the closest hospital emergency department, the [facility], or call 911 for further evaluation and treatment. National Suicide Prevention Lifeline 1-800-SUICIDE or 207-206-6325.  About 988 988 offers 24/7 access to trained crisis counselors who can help people experiencing mental health-related distress. People can call or text 988 or chat 988lifeline.org for themselves or if they are worried about a loved one who may need crisis support.  Crisis Mobile: [facility]:                     905-608-7665 (for crisis response 24 hours a day) [facility] Hotline:                                            352-431-6052   [facility] [phone] Address: [address], [address],  [place], [place] [address]  [facility] [address],  [place], [place], [address] [place] [phone]  [facility] [address] [place], [place] [address] [phone]  [facility] [address] [place]  [phone]

## 2023-09-02 NOTE — ED Notes (Signed)
 Pt has been off the floor and walked out

## 2023-10-17 ENCOUNTER — Emergency Department (HOSPITAL_COMMUNITY)
Admission: EM | Admit: 2023-10-17 | Discharge: 2023-10-17 | Disposition: A | Discharge status: Home / Self Care | Attending: Emergency Medicine | Admitting: Emergency Medicine

## 2023-10-17 ENCOUNTER — Other Ambulatory Visit: Payer: Self-pay

## 2023-10-17 ENCOUNTER — Encounter (HOSPITAL_COMMUNITY): Payer: Self-pay

## 2023-10-17 DIAGNOSIS — R112 Nausea with vomiting, unspecified: Secondary | ICD-10-CM | POA: Diagnosis present

## 2023-10-17 DIAGNOSIS — R197 Diarrhea, unspecified: Secondary | ICD-10-CM | POA: Insufficient documentation

## 2023-10-17 DIAGNOSIS — F1010 Alcohol abuse, uncomplicated: Secondary | ICD-10-CM | POA: Insufficient documentation

## 2023-10-17 LAB — LIPASE, BLOOD: Lipase: 32 U/L (ref 11–51)

## 2023-10-17 LAB — CBC WITH DIFFERENTIAL/PLATELET
Abs Immature Granulocytes: 0 K/uL (ref 0.00–0.07)
Basophils Absolute: 0 K/uL (ref 0.0–0.1)
Basophils Relative: 0 %
Eosinophils Absolute: 0 K/uL (ref 0.0–0.5)
Eosinophils Relative: 0 %
HCT: 39.8 % (ref 36.0–46.0)
Hemoglobin: 13.2 g/dL (ref 12.0–15.0)
Immature Granulocytes: 0 %
Lymphocytes Relative: 41 %
Lymphs Abs: 2.7 K/uL (ref 0.7–4.0)
MCH: 29.1 pg (ref 26.0–34.0)
MCHC: 33.2 g/dL (ref 30.0–36.0)
MCV: 87.9 fL (ref 80.0–100.0)
Monocytes Absolute: 0.6 K/uL (ref 0.1–1.0)
Monocytes Relative: 9 %
Neutro Abs: 3.2 K/uL (ref 1.7–7.7)
Neutrophils Relative %: 50 %
Platelets: 335 K/uL (ref 150–400)
RBC: 4.53 MIL/uL (ref 3.87–5.11)
RDW: 13.5 % (ref 11.5–15.5)
WBC: 6.5 K/uL (ref 4.0–10.5)
nRBC: 0 % (ref 0.0–0.2)

## 2023-10-17 LAB — URINALYSIS, ROUTINE W REFLEX MICROSCOPIC
Bilirubin Urine: NEGATIVE
Glucose, UA: NEGATIVE mg/dL
Ketones, ur: NEGATIVE mg/dL
Leukocytes,Ua: NEGATIVE
Nitrite: NEGATIVE
Protein, ur: 30 mg/dL — AB
Specific Gravity, Urine: 1.028 (ref 1.005–1.030)
pH: 7 (ref 5.0–8.0)

## 2023-10-17 LAB — COMPREHENSIVE METABOLIC PANEL WITH GFR
ALT: 24 U/L (ref 0–44)
AST: 30 U/L (ref 15–41)
Albumin: 3.6 g/dL (ref 3.5–5.0)
Alkaline Phosphatase: 54 U/L (ref 38–126)
Anion gap: 12 (ref 5–15)
BUN: 13 mg/dL (ref 6–20)
CO2: 25 mmol/L (ref 22–32)
Calcium: 8.8 mg/dL — ABNORMAL LOW (ref 8.9–10.3)
Chloride: 101 mmol/L (ref 98–111)
Creatinine, Ser: 0.72 mg/dL (ref 0.44–1.00)
GFR, Estimated: >60 mL/min (ref >60)
Glucose, Bld: 79 mg/dL (ref 70–99)
Potassium: 3.3 mmol/L — ABNORMAL LOW (ref 3.5–5.1)
Sodium: 138 mmol/L (ref 135–145)
Total Bilirubin: 0.6 mg/dL (ref 0.0–1.2)
Total Protein: 7.3 g/dL (ref 6.5–8.1)

## 2023-10-17 LAB — POC URINE PREG, ED: Preg Test, Ur: NEGATIVE

## 2023-10-17 MED ORDER — PANTOPRAZOLE SODIUM 40 MG PO TBEC: 40.0000 mg | DELAYED_RELEASE_TABLET | Freq: Every day | ORAL | 0 refills | Status: AC

## 2023-10-17 MED ORDER — ONDANSETRON 4 MG PO TBDP
4.0000 mg | ORAL_TABLET | Freq: Once | ORAL | Status: AC | PRN
  Administered 2023-10-17: 4 mg via ORAL
  Filled 2023-10-17: qty 1

## 2023-10-17 MED ORDER — LIDOCAINE VISCOUS HCL 2 % MT SOLN
15.0000 mL | Freq: Once | OROMUCOSAL | Status: AC
  Administered 2023-10-17: 15 mL via ORAL
  Filled 2023-10-17: qty 15

## 2023-10-17 MED ORDER — ALUM & MAG HYDROXIDE-SIMETH 200-200-20 MG/5ML PO SUSP
30.0000 mL | Freq: Once | ORAL | Status: AC
  Administered 2023-10-17: 30 mL via ORAL
  Filled 2023-10-17: qty 30

## 2023-10-17 MED ORDER — ONDANSETRON HCL 4 MG PO TABS: 4.0000 mg | ORAL_TABLET | ORAL | 0 refills | Status: AC | PRN

## 2023-10-17 MED ORDER — SUCRALFATE 1 G PO TABS: 1.0000 g | ORAL_TABLET | Freq: Three times a day (TID) | ORAL | 0 refills | Status: AC

## 2023-10-17 NOTE — ED Triage Notes (Signed)
 Pt arrived via POV c/o N/V X 3 days and constant diarrhea. Pt reports her symptoms could be related to alcohol consumption. Pt reports her last drink was this morning.

## 2023-10-17 NOTE — ED Provider Notes (Signed)
 [place] EMERGENCY DEPARTMENT AT [facility] Provider Note  CSN: [id number] Arrival date & time: 10/17/23 1219  Chief Complaint(s) Emesis  HPI Jill Miller is a 28 y.o. female with past medical history as below, significant for chronic alcohol abuse, colon polyps, colostomy who presents to the ED with complaint of n/v/abd cramping   Patient reports she has chronic diarrhea, seems to worsen over the past few days.  Patient also reports intermittent nausea and vomiting over the past few days.  Emesis nonbloody nonbilious.  No melena or BRBPR.  No fevers or chills.  Abdomen discomfort described as cramping, burning at times.  Patient reports that she began drinking heavily recently onset of her symptoms.  Last alcohol intake was this morning.  Last episode emesis was yesterday.  Patient reports that she follows with behavioral health but does not seek regular primary care otherwise. Denies etoh withdrawal. No cp or dib, no palpitations, no recent travel or sick contacts      Past Medical History Past Medical History:  Diagnosis Date   Colon polyps    There are no active problems to display for this patient.  Home Medication(s) Prior to Admission medications   Medication Sig Start Date End Date Taking? Authorizing Provider  ondansetron  (ZOFRAN ) 4 MG tablet Take 1 tablet (4 mg total) by mouth every 4 (four) hours as needed for nausea or vomiting. 10/17/23  Yes Jill Miller A, DO  pantoprazole  (PROTONIX ) 40 MG tablet Take 1 tablet (40 mg total) by mouth daily for 14 days. 10/17/23 10/31/23 Yes Jill Miller A, DO  sertraline (ZOLOFT) 50 MG tablet Take 50 mg by mouth daily.   Yes [provider]  sucralfate  (CARAFATE ) 1 g tablet Take 1 tablet (1 g total) by mouth with breakfast, with lunch, and with evening meal for 7 days. 10/17/23 10/24/23 Yes Jill Miller LABOR, DO  traZODone (DESYREL) 50 MG tablet Take 50 mg by mouth at bedtime.   Yes [provider]  ibuprofen   (ADVIL ) 800 MG tablet Take 1 tablet (800 mg total) by mouth 3 (three) times daily. 01/22/20   Wieters, Rubie BROCKS, PA-C                                                                                                                                    Past Surgical History Past Surgical History:  Procedure Laterality Date   COLOSTOMY     COLOSTOMY REVERSAL     Family History Family History  Problem Relation Age of Onset   Healthy Mother     Social History Social History   Tobacco Use   Smoking status: Never   Smokeless tobacco: Never  Vaping Use   Vaping status: Every Day  Substance Use Topics   Alcohol use: Yes    Comment: 1-2 drinks twice weekly   Drug use: Yes    Frequency: 7.0 times per week    Types: Marijuana  Allergies Patient has no known allergies.  Review of Systems A thorough review of systems was obtained and all systems are negative except as noted in the HPI and PMH.   Physical Exam Vital Signs  I have reviewed the triage vital signs BP 126/82   Pulse 78   Temp 98.6 F (37 C) (Oral)   Resp 16   Ht 4' 11 (1.499 m)   Wt 50 kg   LMP 10/12/2023 (Exact Date)   SpO2 98%   BMI 22.26 kg/m  Physical Exam Vitals and nursing note reviewed.  Constitutional:      General: She is not in acute distress.    Appearance: Normal appearance. She is well-developed. She is not ill-appearing.  HENT:     Head: Normocephalic and atraumatic.     Right Ear: External ear normal.     Left Ear: External ear normal.     Nose: Nose normal.     Mouth/Throat:     Mouth: Mucous membranes are moist.  Eyes:     General: No scleral icterus.       Right eye: No discharge.        Left eye: No discharge.  Cardiovascular:     Rate and Rhythm: Normal rate.  Pulmonary:     Effort: Pulmonary effort is normal. No respiratory distress.     Breath sounds: No stridor.  Abdominal:     General: Abdomen is flat. There is no distension.     Palpations: Abdomen is soft.      Tenderness: There is no abdominal tenderness. There is no guarding.  Musculoskeletal:        General: No deformity.     Cervical back: No rigidity.  Skin:    General: Skin is warm and dry.     Coloration: Skin is not cyanotic, jaundiced or pale.  Neurological:     Mental Status: She is alert.  Psychiatric:        Speech: Speech normal.        Behavior: Behavior normal. Behavior is cooperative.     ED Results and Treatments Labs (all labs ordered are listed, but only abnormal results are displayed) Labs Reviewed  COMPREHENSIVE METABOLIC PANEL WITH GFR - Abnormal; Notable for the following components:      Result Value   Potassium 3.3 (*)    Calcium 8.8 (*)    All other components within normal limits  URINALYSIS, ROUTINE W REFLEX MICROSCOPIC - Abnormal; Notable for the following components:   APPearance CLOUDY (*)    Hgb urine dipstick MODERATE (*)    Protein, ur 30 (*)    Bacteria, UA RARE (*)    All other components within normal limits  LIPASE, BLOOD  CBC WITH DIFFERENTIAL/PLATELET  POC URINE PREG, ED                                                                                                                          Radiology No results  found.  Pertinent labs & imaging results that were available during my care of the patient were reviewed by me and considered in my medical decision making (see MDM for details).  Medications Ordered in ED Medications  ondansetron  (ZOFRAN -ODT) disintegrating tablet 4 mg (4 mg Oral Given 10/17/23 1242)  alum & mag hydroxide-simeth (MAALOX/MYLANTA) 200-200-20 MG/5ML suspension 30 mL (30 mLs Oral Given 10/17/23 1618)    And  lidocaine  (XYLOCAINE ) 2 % viscous mouth solution 15 mL (15 mLs Oral Given 10/17/23 1618)                                                                                                                                     Procedures Procedures  (including critical care time)  Medical Decision Making / ED  Course    Medical Decision Making:    Jill Miller is a 28 y.o. female with past medical history as below, significant for chronic alcohol abuse, colon polyps, colostomy who presents to the ED with complaint of n/v/abd cramping . The complaint involves an extensive differential diagnosis and also carries with it a high risk of complications and morbidity.  Serious etiology was considered. Ddx includes but is not limited to: Differential diagnosis includes but is not exclusive to acute cholecystitis, intrathoracic causes for epigastric abdominal pain, gastritis, duodenitis, pancreatitis, small bowel or large bowel obstruction, abdominal aortic aneurysm, hernia, gastritis, etc.   Complete initial physical exam performed, notably the patient was in nad, sitting comfortably.    Reviewed and confirmed nursing documentation for past medical history, family history, social history.  Vital signs reviewed.    Generalized abd cramping Nausea/vomiting Etoh abuse > - Patient with heavy alcohol abuse, was drinking heavily prior to onset of her symptoms.  She has chronic diarrhea.  No blood in her stool or melanotic stool.  Emesis nonbloody nonbilious.  She is not currently having any pain.  She is feeling better after antiemetic.  Will also give GI cocktail. - K is mild low, likely 2/2 vomiting/poor po intake. No AG, ua w/o ketones, she is well appearing, nontoxic, appears well hydrated   Patient presents with vomiting, diarrhea, and abdominal cramping. Sx suggestive of enteritis or food born illness. Surgical or other more serious etiology appears very unlikely. The patient is improved with ED treatment. Will discharge with observation and symptomatic treatment. Abdominal pain warnings discussed.    4:33 PM: She is feeling better.  Strong encourage patient to cut back on her alcohol abuse, does not appear to be acute withdrawal at this time.  Recommend follow bland diet.  Strict return precautions  provided I have discussed the diagnosis/risks/treatment options with the patient and family.  Evaluation and diagnostic testing in the emergency department does not suggest an emergent condition requiring admission or immediate intervention beyond what has been performed at this time.  They will follow up with pcp, behavioral health. We also discussed returning to the ED  immediately if new or worsening sx occur. We discussed the sx which are most concerning (e.g., sudden worsening pain, fever, inability to tolerate by mouth , yellowing of skin, blood in stool/vomit) that necessitate immediate return.    The patient appears reasonably screen and/or stabilized for discharge and I doubt any other medical condition or other [facility] requiring further screening, evaluation, or treatment in the ED at this time prior to discharge.                        Additional history obtained: -Additional history obtained from friend -External records from outside source obtained and reviewed including: Chart review including previous notes, labs, imaging, consultation notes including  Home medications   Lab Tests: -I ordered, reviewed, and interpreted labs.   The pertinent results include:   Labs Reviewed  COMPREHENSIVE METABOLIC PANEL WITH GFR - Abnormal; Notable for the following components:      Result Value   Potassium 3.3 (*)    Calcium 8.8 (*)    All other components within normal limits  URINALYSIS, ROUTINE W REFLEX MICROSCOPIC - Abnormal; Notable for the following components:   APPearance CLOUDY (*)    Hgb urine dipstick MODERATE (*)    Protein, ur 30 (*)    Bacteria, UA RARE (*)    All other components within normal limits  LIPASE, BLOOD  CBC WITH DIFFERENTIAL/PLATELET  POC URINE PREG, ED    Notable for stable labs  EKG   EKG Interpretation Date/Time:    Ventricular Rate:    PR Interval:    QRS Duration:    QT Interval:    QTC Calculation:   R Axis:      Text  Interpretation:           Imaging Studies ordered: na   Medicines ordered and prescription drug management: Meds ordered this encounter  Medications   ondansetron  (ZOFRAN -ODT) disintegrating tablet 4 mg   AND Linked Order Group    alum & mag hydroxide-simeth (MAALOX/MYLANTA) 200-200-20 MG/5ML suspension 30 mL    lidocaine  (XYLOCAINE ) 2 % viscous mouth solution 15 mL   pantoprazole  (PROTONIX ) 40 MG tablet    Sig: Take 1 tablet (40 mg total) by mouth daily for 14 days.    Dispense:  14 tablet    Refill:  0   sucralfate  (CARAFATE ) 1 g tablet    Sig: Take 1 tablet (1 g total) by mouth with breakfast, with lunch, and with evening meal for 7 days.    Dispense:  21 tablet    Refill:  0   ondansetron  (ZOFRAN ) 4 MG tablet    Sig: Take 1 tablet (4 mg total) by mouth every 4 (four) hours as needed for nausea or vomiting.    Dispense:  6 tablet    Refill:  0    -I have reviewed the patients home medicines and have made adjustments as needed   Consultations Obtained: na   Cardiac Monitoring: Continuous pulse oximetry interpreted by myself, 99% on RA.    Social Determinants of Health:  Diagnosis or treatment significantly limited by social determinants of health: alcohol use Encouraged reduction/cessation, discussed treatment strategies, pt will consider treatment/cessation options   Reevaluation: After the interventions noted above, I reevaluated the patient and found that they have improved  Co morbidities that complicate the patient evaluation  Past Medical History:  Diagnosis Date   Colon polyps       Dispostion: Disposition decision including need for hospitalization  was considered, and patient discharged from emergency department.    Final Clinical Impression(s) / ED Diagnoses Final diagnoses:  Nausea vomiting and diarrhea  Alcohol abuse        Jill Jayson LABOR, DO 10/17/23 1633

## 2023-10-17 NOTE — Discharge Instructions (Addendum)
 You should return to the hospital if you experience return of persistent nausea and vomiting that does not resolve and does not allow you to tolerate any food or fluids, persistent fevers for greater than 2-3 more days, increasing abdominal pain that persists despite medications, persistent diarrhea, dizziness, syncope (fainting), or for any other concerns.   Please cut back on your alcohol use   Please return to the emergency department immediately for any new or concerning symptoms, or if you get worse.     RESOURCE GUIDE  Chronic Pain Problems: Contact [facility]  [phone] Patients need to be referred by their primary care doctor.  Insufficient Money for Medicine: Contact [employer]:  call [phone]  No Primary Care Doctor: Call [facility]  [phone] - can help you locate a primary care doctor that  accepts your insurance, provides certain services, etc. Physician Referral Service- [phone]  Agencies that provide inexpensive medical care: [facility]  [phone] [facility]  [phone] [facility]  [phone] [facility]  [phone] [facility]  [phone] [facility]  [phone]  Medicaid-accepting [facility] Providers: [facility]- [address], [address]  [phone], Mon-Fri 9am-7pm, Sat 9am-1pm [facility]- [address] [place], [address] [place]  [phone] [facility]- [address], [address] [place]  [phone] [facility]- [address]  [phone] Kennieth Leech- [address] [place], [address], [phone]  Only accepts [place] patients after they have their name  applied to their card  Self Pay (no insurance) in [facility]: Sickle Cell Patients - [facility]  [address] [place], [phone] [facility]- [address] [place]  [phone]       [place] [facility] [place]- [address]       -     [facility]- see information above (Speak to [employer] if you do not have insurance)       -  [facility]- [address],  [phone]       -  [facility]- [address] [phone]       -  Dr Catalina-  [address], [place], [phone]       -  [facility] - [address], [phone]       -  [facility]- [address] [phone], also [address], [phone]       -     [facility]- [address] [place], [phone], 1st & 3rd Saturday         every month, 10am-1pm  -     [facility]   [address] [place], [place].   Phone:  [phone], Fax:  [phone]. Hours of Operation:  9 am - 6 pm, M-F.  -     [facility]   [address], [place]   Phone: [phone], Fax: [phone]. Hours of Operation:  8:30 am - 5:30 pm, M-F.    Dental Assistance If unable to pay or uninsured, contact:  [facility]. to become qualified for the adult dental clinic.  Patients with Medicaid: [facility] [phone] [address], [phone] [address] [phone]  If unable to pay, or uninsured, contact [facility] [phone] in [place], [phone] in [facility]) to become qualified for the adult dental clinic  [facility] [address] [place], [place] [address] [phone] [email or web address]  Other Proofreader Services: [facility]- [address] [place], [place], [place], [address], [phone], Ext. [phone], 2nd and 4th Thursday of the month at 6:30am.  10 clients each day by appointment, can sometimes see walk-in patients if someone does not show for an appointment. [facility]- [address] [place], [place], [address], [phone] [facility] [address]  [place], [place], [address], [phone] [facility]- [phone] [facility]- [phone] [facility] [phone]

## 2023-10-17 NOTE — ED Notes (Signed)
Patient tolerated fluids
